# Patient Record
Sex: Male | Born: 1990 | Race: White | Hispanic: No | Marital: Single | Smoking: Never smoker
Health system: Southern US, Community
[De-identification: ages and names within clinical notes are randomized; demographics above are authoritative.]

## PROBLEM LIST (undated history)

## (undated) DIAGNOSIS — J45909 Unspecified asthma, uncomplicated: Secondary | ICD-10-CM

## (undated) DIAGNOSIS — I10 Essential (primary) hypertension: Secondary | ICD-10-CM

## (undated) HISTORY — DX: Essential (primary) hypertension: I10

## (undated) HISTORY — DX: Unspecified asthma, uncomplicated: J45.909

---

## 2015-01-31 ENCOUNTER — Emergency Department
Admit: 2015-01-31 | Disposition: A | Discharge status: Home / Self Care | Payer: Self-pay | Admitting: Emergency Medicine

## 2015-01-31 LAB — URINALYSIS, COMPLETE
BILIRUBIN, UR: NEGATIVE
Bacteria: NONE SEEN
Blood: NEGATIVE
Glucose,UR: NEGATIVE mg/dL (ref 0–75)
KETONE: NEGATIVE
LEUKOCYTE ESTERASE: NEGATIVE
Nitrite: NEGATIVE
PH: 6 (ref 4.5–8.0)
Protein: NEGATIVE
RBC, UR: NONE SEEN /HPF (ref 0–5)
SQUAMOUS EPITHELIAL: NONE SEEN
Specific Gravity: 1.008 (ref 1.003–1.030)

## 2015-01-31 LAB — CBC
HCT: 48.7 % (ref 40.0–52.0)
HGB: 16.5 g/dL (ref 13.0–18.0)
MCH: 29.7 pg (ref 26.0–34.0)
MCHC: 33.9 g/dL (ref 32.0–36.0)
MCV: 88 fL (ref 80–100)
Platelet: 367 10*3/uL (ref 150–440)
RBC: 5.57 10*6/uL (ref 4.40–5.90)
RDW: 13.4 % (ref 11.5–14.5)
WBC: 10.5 10*3/uL (ref 3.8–10.6)

## 2015-01-31 LAB — COMPREHENSIVE METABOLIC PANEL
ALK PHOS: 57 U/L
Albumin: 4.9 g/dL
Anion Gap: 7 (ref 7–16)
BILIRUBIN TOTAL: 0.5 mg/dL
BUN: 14 mg/dL
CREATININE: 0.87 mg/dL
Calcium, Total: 9.7 mg/dL
Chloride: 108 mmol/L
Co2: 26 mmol/L
EGFR (African American): >60
EGFR (Non-African Amer.): >60
Glucose: 92 mg/dL
POTASSIUM: 3.7 mmol/L
SGOT(AST): 21 U/L
SGPT (ALT): 16 U/L — ABNORMAL LOW
SODIUM: 141 mmol/L
TOTAL PROTEIN: 8.4 g/dL — AB

## 2015-01-31 LAB — ACETAMINOPHEN LEVEL: Acetaminophen: <10 ug/mL

## 2015-01-31 LAB — DRUG SCREEN, URINE
Amphetamines, Ur Screen: NEGATIVE
BENZODIAZEPINE, UR SCRN: NEGATIVE
Barbiturates, Ur Screen: NEGATIVE
Cannabinoid 50 Ng, Ur ~~LOC~~: NEGATIVE
Cocaine Metabolite,Ur ~~LOC~~: NEGATIVE
MDMA (ECSTASY) UR SCREEN: NEGATIVE
Methadone, Ur Screen: NEGATIVE
Opiate, Ur Screen: NEGATIVE
Phencyclidine (PCP) Ur S: NEGATIVE
Tricyclic, Ur Screen: NEGATIVE

## 2015-01-31 LAB — ETHANOL: Ethanol: <5 mg/dL

## 2015-01-31 LAB — SALICYLATE LEVEL: Salicylates, Serum: <4 mg/dL

## 2015-02-09 NOTE — Consult Note (Addendum)
PATIENT NAME:  Andrew Cherry, Jayshun MR#:  [id number] DATE OF BIRTH:  [birth year]  DATE OF CONSULTATION:  01/31/2015  REFERRING PHYSICIAN:   CONSULTING PHYSICIAN:  Audery AmelJohn T. Leeon Makar, MD  IDENTIFYING INFORMATION AND REASON FOR CONSULTATION: A 24 year old man with no significant past medical or psychiatric history who is brought in under involuntary commitment.   CHIEF COMPLAINT: "I overreacted."   HISTORY OF PRESENT ILLNESS: Information from the patient and the chart. The patient states that he broke up with his girlfriend just before spring break, which was several weeks ago. Ever since then he has been sort of regretting it. Despite this she does not seem to be inclined to get back together. For the first week or 2 he was feeling really sad and not eating or drinking well, although he says that that is improved for the last week and actually his mood had been a little bit better. He has been going to class and has been able to concentrate. Has been sleeping adequately, has been eating adequately. However yesterday he was scheduled to go to an important celebration where he won a scholarship and had asked this girlfriend, or ex-girlfriend, to come with them. She stood him up. He got really upset about it and was texting her. He admits that he made statements about how he was going to kill himself by jumping in front of a train. The patient absolutely denies that he had any actual thought or plan of doing anything to kill himself, but says he was just upset. Today his mood is better. He says he realizes that this was a ridiculous and inappropriate thing to say. He totally denies suicidal ideation. He has [facility] [employer] [employer] [facility] this week and has a plan to follow up with another appointment next week.   PAST PSYCHIATRIC HISTORY: He saw a therapist twice during high school for what he describes as social anxiety symptoms. Never prescribed any medicine and did not have any more therapy  than that. Never been in a psychiatric hospital. Denies any history of suicide attempts or violence.   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 He has been together with this girl for something like a year and a half when they broke up a few weeks ago. Sounds like he does not have a very warm relationship with his parents, he says that they are not really supportive of his feelings and he has not felt like he has been able to talk to them. He   does live with a roommate at school and it sounds like he has Education officer, environmental(Dictation Anomaly)  adequate social support there.  PAST MEDICAL HISTORY: Has very mild asthma for which he uses an as needed albuterol inhaler. No other ongoing medical problems.   SUBSTANCE ABUSE HISTORY: He says he only drinks on the weekend, but will sometimes drink 10 beers a day on the weekends. So far he does not feel like it has had ever caused him a problem, no one else seems to have ever indicated believing it was a problem. He says he has used marijuana a few times, but does not do it regularly. Denies other drug use.   FAMILY HISTORY: Denies knowing of any family history of mental health problems.   REVIEW OF SYSTEMS:  Currently denies depressed mood. Denies suicidal ideation. Denies any hallucinations. Physically his full review of systems is totally negative.   CURRENT MEDICATIONS: None.   ALLERGIES: No known drug allergies.  MENTAL STATUS EXAMINATION: Neatly groomed young man who looks his stated age, cooperative with the interview. Eye contact good. Psychomotor activity appropriate. Speech normal rate, tone, and volume. Affect is euthymic, reactive, appropriate. Mood stated as being all right, but mad at himself. Thoughts are lucid without loosening of associations or delusions. Denies auditory or visual hallucinations. Denies suicidal or homicidal ideation. He is alert and oriented x 4. Repeats 3 words readily and remembers all 3 at 3 minutes. Judgment  and insight appear to be improved and intact. Intelligence and fund of knowledge normal.   LABORATORY RESULTS: Alcohol level negative. Chemistry panel, nothing remarkable. Drug screen is all negative. CBC completely normal. Acetaminophen and salicylates negative.   VITAL SIGNS: His blood pressure is currently 135/90, respirations 17, pulse 77, temperature 97.8.   ASSESSMENT: A 24 year old man who made an impulsive statement about jumping in front of a train, but did not do anything to act on it. He says that this was just because he was angry and he never had any intention of doing anything to act on it. He had some symptoms that could be consistent with depression a couple of weeks ago, but those are spontaneously getting better. At this point I do not think he meets commitment criteria or requires inpatient treatment. There is no really clear indication to start medication either. I think I would give this a diagnosis of adjustment disorder.   TREATMENT PLAN: The case discussed with Emergency Room physician. He will be discharged from involuntary commitment and can follow up as scheduled with [facility]. He is counseled that if suicidal ideation recurs and is serious he needs to talk to someone and get help immediately.    DIAGNOSIS PRINCIPAL AND PRIMARY:   AXIS I: Adjustment disorder with depressed mood.   SECONDARY DIAGNOSES:   AXIS I: No further diagnosis.   AXIS II: No diagnosis.   AXIS III: Mild asthma.     ____________________________ Audery Amel, MD jtc:bu D: 01/31/2015 14:24:47 ET T: 01/31/2015 14:58:49 ET JOB#: [id number]  cc: Audery Amel, MD, <Dictator> Audery Amel MD ELECTRONICALLY SIGNED 02/14/2015 19:30

## 2015-11-13 ENCOUNTER — Other Ambulatory Visit: Payer: Self-pay | Admitting: Family Medicine

## 2015-11-13 ENCOUNTER — Ambulatory Visit
Admission: RE | Admit: 2015-11-13 | Discharge: 2015-11-13 | Disposition: A | Discharge status: Home / Self Care | Payer: BLUE CROSS/BLUE SHIELD | Source: Ambulatory Visit | Attending: Family Medicine | Admitting: Family Medicine

## 2015-11-13 DIAGNOSIS — J45909 Unspecified asthma, uncomplicated: Secondary | ICD-10-CM | POA: Insufficient documentation

## 2015-11-13 DIAGNOSIS — R918 Other nonspecific abnormal finding of lung field: Secondary | ICD-10-CM | POA: Diagnosis not present

## 2015-11-13 DIAGNOSIS — R0600 Dyspnea, unspecified: Secondary | ICD-10-CM | POA: Insufficient documentation

## 2015-11-13 DIAGNOSIS — R55 Syncope and collapse: Secondary | ICD-10-CM | POA: Diagnosis not present

## 2015-11-19 ENCOUNTER — Ambulatory Visit (INDEPENDENT_AMBULATORY_CARE_PROVIDER_SITE_OTHER): Payer: BLUE CROSS/BLUE SHIELD | Admitting: Internal Medicine

## 2015-11-19 ENCOUNTER — Encounter: Payer: Self-pay | Admitting: Internal Medicine

## 2015-11-19 ENCOUNTER — Other Ambulatory Visit
Admission: RE | Admit: 2015-11-19 | Discharge: 2015-11-19 | Disposition: A | Discharge status: Home / Self Care | Payer: BLUE CROSS/BLUE SHIELD | Source: Ambulatory Visit | Attending: Internal Medicine | Admitting: Internal Medicine

## 2015-11-19 ENCOUNTER — Telehealth: Payer: Self-pay | Admitting: Internal Medicine

## 2015-11-19 VITALS — BP 122/70 | HR 84 | Ht 74.0 in | Wt 180.6 lb

## 2015-11-19 DIAGNOSIS — F411 Generalized anxiety disorder: Secondary | ICD-10-CM

## 2015-11-19 DIAGNOSIS — R079 Chest pain, unspecified: Secondary | ICD-10-CM

## 2015-11-19 DIAGNOSIS — J4541 Moderate persistent asthma with (acute) exacerbation: Secondary | ICD-10-CM | POA: Diagnosis not present

## 2015-11-19 LAB — TROPONIN I

## 2015-11-19 LAB — FIBRIN DERIVATIVES D-DIMER (ARMC ONLY): Fibrin derivatives D-dimer (ARMC): 48 (ref 0–499)

## 2015-11-19 MED ORDER — FLUTICASONE FUROATE 200 MCG/ACT IN AEPB: 1.0000 | INHALATION_SPRAY | Freq: Every day | RESPIRATORY_TRACT | Status: AC

## 2015-11-19 MED ORDER — FLUTICASONE FUROATE 200 MCG/ACT IN AEPB
1.0000 | INHALATION_SPRAY | Freq: Every day | RESPIRATORY_TRACT | Status: AC
Start: 2015-11-19 — End: 2015-11-20

## 2015-11-19 NOTE — Addendum Note (Signed)
Addended by: Maxwell Marion A on: 11/19/2015 11:20 AM   Modules accepted: Orders

## 2015-11-19 NOTE — Addendum Note (Signed)
Addended by: Meyer Cory R on: 11/19/2015 11:54 AM   Modules accepted: Orders

## 2015-11-19 NOTE — Addendum Note (Signed)
Addended by: Maxwell Marion A on: 11/19/2015 11:11 AM   Modules accepted: Orders

## 2015-11-19 NOTE — Addendum Note (Signed)
Addended by: Maxwell Marion A on: 11/19/2015 11:29 AM   Modules accepted: Orders

## 2015-11-19 NOTE — Progress Notes (Signed)
[facility] [place] [facility] Consultation      Assessment and Plan:  Asthma. -Possible asthma, discussed in depth the possibility that he may have asthma. We'll start him on a steroid inhaler to be used once daily. We'll check pulmonary function testing.  Anxiety. -Discussed the possibility of anxiety contributing to his symptoms. His dyspnea could be triggering anxiety, which could be making his dyspnea worse. We discussed the possibility that this may be the issue, first we will rule out other organic causes of dyspnea such as cardiac and pulmonary disease.  Dyspnea. -The etiology is uncertain, but I suspect this is due to asthma, possibly exacerbated by anxiety. Given the low probability of pulmonary embolism, I will check a d-dimer. He is concerned about cardiac issues, and is interested in getting a cardiac enzyme test, I explained that this is likely of little benefit but would order it helped, his fears about cardiac issues. -We will check a pulmonary pulmonary exercise test. -We will check a echocardiogram. -We'll check a pulmonary function testing.  Fatigue, excessive daytime sleepiness. -Currently the symptoms do not appear to be consistent with sleep apnea, can readdress once further testing results are available.  Date: 11/19/2015  MRN# [id number] Andrew Cherry 1990-12-29  Referring Physician: Dr. Barnie Del is a 25 y.o. old male seen in consultation for chief complaint of:    Chief Complaint  Patient presents with  . pulmonary consult    pt. ref by [facility]. pt c/o SOB, chest tightness. fatigued. confusion X1wk denies cough, wheezing or chest pain.     HPI:   Mr. Andrew Cherry is a 25 year old referred with asthma. On review of his medical history, and recent medical records, it shows that the patient initially presented to his physician on February 1 of this year with lightheadedness, dyspnea, paresthesias. It was noted that his symptoms initially began  around December 23, when he went skiing. Since that time it was noted that he had increasing dyspnea on exertion as well as dyspnea at rest.  He feels that his symptoms started in December, but things got worse this past week, with chest tightness, which made him very tired. It also really triggered his anxiety, and often felt that he could not speak and recalls feeling very scared. He also feels very fatigued and tired.   This has never happened before. He has never been diagnosed with allergies, he has no pets, he had a dog when growing up. He does not smoke. He has not drank in 2 weeks.  He drinks 10 to 15 drinks per week, this is usually over 1 or 2 nights. When he drinks primarily beer.  He has been on z pack, xopenex. He did not feel that the inhaler helped at all. He went to [place] 2 weeks ago for now boarding but had a lot of trouble breathing and could not participate much. He denies sinus symptoms. He himself has had asthma in the past.   He is graduating in May from [employer], he is going to take a job in [place] in July.   Review of lab studies from from her second 2017: Normal metabolic panel. Chest x-ray 11/13/2015, images report reviewed: Per report, there is some mild peribronchial cuffing, my review, this is otherwise a normal chest x-ray.  He was ambulated in the office today, Beginning 02 sat was 99% with HR 88. After walking 1500 feet sat was 98% with HR 102.   PMHX:   Past Medical History  Diagnosis Date  .  Hypertension   . Asthma    Surgical Hx:  History reviewed. No pertinent past surgical history. Family Hx:  Family History  Problem Relation Age of Onset  . Family history unknown: Yes   Social Hx:   Social History  Substance Use Topics  . Smoking status: Never Smoker   . Smokeless tobacco: None  . Alcohol Use: Yes   Medication:   Current Outpatient Rx  Name  Route  Sig  Dispense  Refill  . cholecalciferol (VITAMIN D) 1000 units tablet   Oral   Take 5,000  Units by mouth daily.         Marland Kitchen levalbuterol (XOPENEX HFA) 45 MCG/ACT inhaler                   Allergies:  Review of patient's allergies indicates not on file.  Review of Systems: Gen:  Denies  fever, sweats, chills HEENT: Denies . bleeds, sore throat Cvc:  No dizziness, chest pain. Resp:   Denies cough or sputum porduction,  Gi: Denies swallowing difficulty, stomach pain. Gu:  Denies bladder incontinence, burning urine Ext:   No Joint pain, stiffness. Skin: No skin rash,  hives Endoc:  No polyuria, polydipsia. Psych: No depression, insomnia. Other:  All other systems were reviewed with the patient and were negative other that what is mentioned in the HPI.   Physical Examination:   VS: BP 122/70 mmHg  Pulse 84  Ht  (1.88 m)  Wt 180 lb 9.6 oz (81.92 kg)  BMI 23.18 kg/m2  SpO2 96%  General Appearance: No distress , he appears anxious today. Neuro:without focal findings,  speech normal,  HEENT: PERRLA, EOM intact.   Pulmonary: normal breath sounds, No wheezing.  CardiovascularNormal S1,S2.  No m/r/g.   Abdomen: Benign, Soft, non-tender. Renal:  No costovertebral tenderness  GU:  No performed at this time. Endoc: No evident thyromegaly, no signs of acromegaly. Skin:   warm, no rashes, no ecchymosis  Extremities: normal, no cyanosis, clubbing.  Other findings:    LABORATORY PANEL:   CBC No results for input(s): WBC, HGB, HCT, PLT in the last 168 hours. ------------------------------------------------------------------------------------------------------------------  Chemistries  No results for input(s): NA, K, CL, CO2, GLUCOSE, BUN, CREATININE, CALCIUM, MG, AST, ALT, ALKPHOS, BILITOT in the last 168 hours.  Invalid input(s): GFRCGP ------------------------------------------------------------------------------------------------------------------  Cardiac Enzymes No results for input(s): TROPONINI in the last 168  hours. ------------------------------------------------------------  RADIOLOGY:  No results found.     Thank  you for the consultation and for allowing [facility] to assist in the care of your patient. Our recommendations are noted above.  Please contact us if we can be of further service.   Wells Guiles, MD.  Board Certified in Internal Medicine, Pulmonary Medicine, Critical Care Medicine, and Sleep Medicine.  [facility]  Santiago Glad, M.D.  Stephanie Acre, M.D.  Billy Fischer, M.D

## 2015-11-19 NOTE — Patient Instructions (Addendum)
-  d-dimer, troponin test to be done today. -Cardiopulmonary exercise testing. -Complete pulmonary function testing. -Echocardiogram with contrast.  -Start Arnuity inhaler 1 puff once daily. Rinse out her mouth after each use. -When her breathing becomes worse, try to, her breathing down by breathing in for one second and blowing out for 3 seconds. -Continue working on relaxation techniques-trying not to spend time worrying about  breathing issues, as this may only make breathing worse.

## 2015-11-19 NOTE — Telephone Encounter (Signed)
Called and spoke with the pt's Mother and EC, Annice Pih  We had a 10 min conversation about today's visit  She was concerned that the CPST is not scheduled until 2 wks from now  I advised that this seems reasonable to me, but will ask Dr Nicholos Johns if he feels that this is booked too far out  She also asked about why an abx was not prescribed, b/c the reason for the visit today was to discuss "lung infection" I advised that if need for abx was indicated we could have prescribed that today, but since there was no indication for one (purulent sputum, fever) this was not necc  She understands this, but wants to know if infection was discussed at all  She seems very anxious about what to do in case pt starts feeling bad again, b/c today was a "good day" I advised her that they can always call for any questions/concerns, but incase of emergency to seek emergent care  She verbalized understanding  Please advise on your thoughts about waiting 2 wks for CPST and also about possibility of infections playing a part of his fatigue and dyspnea Thanks!

## 2015-11-20 ENCOUNTER — Other Ambulatory Visit: Payer: Self-pay

## 2015-11-20 ENCOUNTER — Encounter: Payer: Self-pay | Admitting: Internal Medicine

## 2015-11-20 ENCOUNTER — Ambulatory Visit (INDEPENDENT_AMBULATORY_CARE_PROVIDER_SITE_OTHER): Payer: BLUE CROSS/BLUE SHIELD

## 2015-11-20 DIAGNOSIS — R079 Chest pain, unspecified: Secondary | ICD-10-CM

## 2015-11-21 ENCOUNTER — Telehealth: Payer: Self-pay

## 2015-11-21 NOTE — Telephone Encounter (Signed)
Pt mother called, and would like lab results. Also, she would like to know if pt appointments should be sooner, please. Call.

## 2015-11-21 NOTE — Telephone Encounter (Signed)
Spoke with pt and mother and they are both informed of response. Nothing further needed.

## 2015-11-21 NOTE — Telephone Encounter (Signed)
2 weeks for CP stress test is ok.  Infection was addressed, there is no signs of infection at this time and the CXR was clear.

## 2015-11-21 NOTE — Telephone Encounter (Signed)
Pt and mother both informed of lab results. Nothing further needed.

## 2015-11-25 ENCOUNTER — Emergency Department (HOSPITAL_COMMUNITY)
Admission: EM | Admit: 2015-11-25 | Discharge: 2015-11-25 | Disposition: A | Discharge status: Home / Self Care | Payer: BLUE CROSS/BLUE SHIELD | Attending: Emergency Medicine | Admitting: Emergency Medicine

## 2015-11-25 ENCOUNTER — Encounter (HOSPITAL_COMMUNITY): Payer: Self-pay

## 2015-11-25 ENCOUNTER — Telehealth: Payer: Self-pay | Admitting: *Deleted

## 2015-11-25 ENCOUNTER — Ambulatory Visit (HOSPITAL_COMMUNITY): Payer: BLUE CROSS/BLUE SHIELD

## 2015-11-25 ENCOUNTER — Emergency Department (HOSPITAL_COMMUNITY): Payer: BLUE CROSS/BLUE SHIELD

## 2015-11-25 DIAGNOSIS — M6281 Muscle weakness (generalized): Secondary | ICD-10-CM | POA: Diagnosis not present

## 2015-11-25 DIAGNOSIS — R200 Anesthesia of skin: Secondary | ICD-10-CM | POA: Insufficient documentation

## 2015-11-25 DIAGNOSIS — R251 Tremor, unspecified: Secondary | ICD-10-CM | POA: Insufficient documentation

## 2015-11-25 DIAGNOSIS — Z79899 Other long term (current) drug therapy: Secondary | ICD-10-CM | POA: Diagnosis not present

## 2015-11-25 DIAGNOSIS — J45909 Unspecified asthma, uncomplicated: Secondary | ICD-10-CM | POA: Diagnosis not present

## 2015-11-25 DIAGNOSIS — R410 Disorientation, unspecified: Secondary | ICD-10-CM | POA: Insufficient documentation

## 2015-11-25 DIAGNOSIS — R5383 Other fatigue: Secondary | ICD-10-CM | POA: Diagnosis not present

## 2015-11-25 DIAGNOSIS — I10 Essential (primary) hypertension: Secondary | ICD-10-CM | POA: Insufficient documentation

## 2015-11-25 DIAGNOSIS — R5381 Other malaise: Secondary | ICD-10-CM | POA: Insufficient documentation

## 2015-11-25 LAB — BASIC METABOLIC PANEL
ANION GAP: 12 (ref 5–15)
BUN: 11 mg/dL (ref 6–20)
CHLORIDE: 104 mmol/L (ref 101–111)
CO2: 26 mmol/L (ref 22–32)
Calcium: 10.1 mg/dL (ref 8.9–10.3)
Creatinine, Ser: 0.95 mg/dL (ref 0.61–1.24)
GFR calc Af Amer: >60 mL/min (ref >60)
GFR calc non Af Amer: >60 mL/min (ref >60)
Glucose, Bld: 103 mg/dL — ABNORMAL HIGH (ref 65–99)
Potassium: 4.1 mmol/L (ref 3.5–5.1)
Sodium: 142 mmol/L (ref 135–145)

## 2015-11-25 LAB — CBC
HEMATOCRIT: 46.5 % (ref 39.0–52.0)
HEMOGLOBIN: 16.6 g/dL (ref 13.0–17.0)
MCH: 30.3 pg (ref 26.0–34.0)
MCHC: 35.7 g/dL (ref 30.0–36.0)
MCV: 85 fL (ref 78.0–100.0)
Platelets: 290 10*3/uL (ref 150–400)
RBC: 5.47 MIL/uL (ref 4.22–5.81)
RDW: 12.3 % (ref 11.5–15.5)
WBC: 7.9 10*3/uL (ref 4.0–10.5)

## 2015-11-25 LAB — URINALYSIS, ROUTINE W REFLEX MICROSCOPIC
Bilirubin Urine: NEGATIVE
GLUCOSE, UA: NEGATIVE mg/dL
Hgb urine dipstick: NEGATIVE
Ketones, ur: NEGATIVE mg/dL
Leukocytes, UA: NEGATIVE
NITRITE: NEGATIVE
PH: 7 (ref 5.0–8.0)
Protein, ur: NEGATIVE mg/dL
SPECIFIC GRAVITY, URINE: 1.005 (ref 1.005–1.030)

## 2015-11-25 LAB — I-STAT CG4 LACTIC ACID, ED: Lactic Acid, Venous: 1.58 mmol/L (ref 0.5–2.0)

## 2015-11-25 NOTE — Telephone Encounter (Signed)
Patient's mother called and patient was unable to perform stress and sent to the ED at [facility]. She is very worried and would like for you to call her and have Dr. Nicholos Johns informed.

## 2015-11-25 NOTE — Discharge Instructions (Signed)
CT scan of your brain, and the rest of the laboratory evaluations, were normal today. We recommend that you follow-up with the neurologist tomorrow as scheduled, and continue to work with your primary care doctor and pulmonologist to determine the source of your discomfort.     Fatigue Fatigue is feeling tired all of the time, a lack of energy, or a lack of motivation. Occasional or mild fatigue is often a normal response to activity or life in general. However, long-lasting (chronic) or extreme fatigue may indicate an underlying medical condition. HOME CARE INSTRUCTIONS  Watch your fatigue for any changes. The following actions may help to lessen any discomfort you are feeling:  Talk to your health care provider about how much sleep you need each night. Try to get the required amount every night.  Take medicines only as directed by your health care provider.  Eat a healthy and nutritious diet. Ask your health care provider if you need help changing your diet.  Drink enough fluid to keep your urine clear or pale yellow.  Practice ways of relaxing, such as yoga, meditation, massage therapy, or acupuncture.  Exercise regularly.   Change situations that cause you stress. Try to keep your work and personal routine reasonable.  Do not abuse illegal drugs.  Limit alcohol intake to no more than 1 drink per day for nonpregnant women and 2 drinks per day for men. One drink equals 12 ounces of beer, 5 ounces of wine, or 1 ounces of hard liquor.  Take a multivitamin, if directed by your health care provider. SEEK MEDICAL CARE IF:   Your fatigue does not get better.  You have a fever.   You have unintentional weight loss or gain.  You have headaches.   You have difficulty:   Falling asleep.  Sleeping throughout the night.  You feel angry, guilty, anxious, or sad.   You are unable to have a bowel movement (constipation).   You skin is dry.   Your legs or another part  of your body is swollen.  SEEK IMMEDIATE MEDICAL CARE IF:   You feel confused.   Your vision is blurry.  You feel faint or pass out.   You have a severe headache.   You have severe abdominal, pelvic, or back pain.   You have chest pain, shortness of breath, or an irregular or fast heartbeat.   You are unable to urinate or you urinate less than normal.   You develop abnormal bleeding, such as bleeding from the rectum, vagina, nose, lungs, or nipples.  You vomit blood.   You have thoughts about harming yourself or committing suicide.   You are worried that you might harm someone else.    This information is not intended to replace advice given to you by your health care provider. Make sure you discuss any questions you have with your health care provider.   Document Released: 07/25/2007 Document Revised: 10/18/2014 Document Reviewed: 01/29/2014 Elsevier Interactive Patient Education [employer]

## 2015-11-25 NOTE — Telephone Encounter (Signed)
Spoke with mom and she states that the pt couldn't complete the stress test and was taken to [facility] ED. She was concerned that pt was there alone and questioned whether or not pulmonary doctor  Would come to see pt in ED. Informed her if the ED doc needs pulmonary that they would call them in. Mom wants to make you aware.

## 2015-11-25 NOTE — ED Notes (Signed)
Family at bedside. 

## 2015-11-25 NOTE — ED Provider Notes (Signed)
CSN: [id number]     Arrival date & time 11/25/15  1347 History   First MD Initiated Contact with Patient 11/25/15 1713     Chief Complaint  Patient presents with  . from cardiology   . Fatigue     (Consider location/radiation/quality/duration/timing/severity/associated sxs/prior Treatment) HPI   Andrew Cherry is a 25 y.o. male who is here for evaluation of an episode of shakiness, global weakness, confusion and ongoing fatigue. He was being readied for a cardiac stress test today, at a local cardiology office, when he had onset of symptoms, which prevented him from beginning the test. He describes "shaking uncontrollably" after that, he had onset of bilateral leg numbness associated with weakness in both legs. He was able to ambulate, and was brought here by medical staff for evaluation. At the time of evaluation, by me, he states that the shakiness is almost completely gone. The numbness is fairly minimal. He presented to triage where labs were obtained. He is being seen by his primary care provider at a local college, and has been referred to pulmonary who treated him for asthma was opened next and a steroid inhaler. He stopped using the steroid inhaler and uses Xopenex, twice a day. He states the Xopenex doesn't do anything to help his feeling of shortness of breath, which is chronic and ongoing for at least a month. He reports having an appointment tomorrow with a neurologist for further evaluation of his symptoms. He denies depression, anxiety or significant stress at this time. He is in college, his last semester and has a job already secured to start in May 2017. He states he goes to class when he feels like it, but is not in danger of failing the classes at this time. He is here with his girlfriend, and states that he is getting along with her very well. There are no other known modifying factors.   Past Medical History  Diagnosis Date  . Hypertension   . Asthma    History reviewed.  No pertinent past surgical history. Family History  Problem Relation Age of Onset  . Family history unknown: Yes   Social History  Substance Use Topics  . Smoking status: Never Smoker   . Smokeless tobacco: None  . Alcohol Use: Yes    Review of Systems  All other systems reviewed and are negative.     Allergies  Review of patient's allergies indicates no known allergies.  Home Medications   Prior to Admission medications   Medication Sig Start Date End Date Taking? Authorizing Provider  cholecalciferol (VITAMIN D) 1000 units tablet Take 5,000 Units by mouth daily.   Yes Historical Provider, MD  levalbuterol [facility] HFA) 45 MCG/ACT inhaler Inhale 1-2 puffs into the lungs every 6 (six) hours as needed for wheezing or shortness of breath.  11/13/15  Yes Historical Provider, MD  Fluticasone Furoate (ARNUITY ELLIPTA) 200 MCG/ACT AEPB Inhale 1 puff into the lungs daily. Patient not taking: Reported on 11/25/2015 11/19/15   Shane Crutch, MD   BP 126/76 mmHg  Pulse 81  Temp(Src) 98.9 F (37.2 C) (Oral)  Resp 21  Ht  (1.905 m)  Wt 177 lb (80.287 kg)  BMI 22.12 kg/m2  SpO2 99% Physical Exam  Constitutional: He is oriented to person, place, and time. He appears well-developed and well-nourished. No distress.  HENT:  Head: Normocephalic and atraumatic.  Right Ear: External ear normal.  Left Ear: External ear normal.  Eyes: Conjunctivae and EOM are normal. Pupils are equal,  round, and reactive to light.  Neck: Normal range of motion and phonation normal. Neck supple.  Cardiovascular: Normal rate, regular rhythm and normal heart sounds.   Pulmonary/Chest: Effort normal and breath sounds normal. He exhibits no bony tenderness.  Abdominal: Soft. There is no tenderness.  Musculoskeletal: Normal range of motion.  Neurological: He is alert and oriented to person, place, and time. No cranial nerve deficit or sensory deficit. He exhibits normal muscle tone. Coordination normal.   No dysarthria, aphasia or nystagmus. Normal gait.  Skin: Skin is warm, dry and intact.  Psychiatric: He has a normal mood and affect. His behavior is normal. Judgment and thought content normal.  Nursing note and vitals reviewed.   ED Course  Procedures (including critical care time)  The patient requested a CT scan of the brain to be evaluated for his problems. I ordered that with the indicators being his symptoms of shakiness, weakness, and numbness.  Medications - No data to display  Patient Vitals for the past 24 hrs:  BP Temp Temp src Pulse Resp SpO2 Height Weight  11/25/15 1800 126/76 mmHg - - 81 21 99 % - -  11/25/15 1715 144/82 mmHg - - 89 22 100 % - -  11/25/15 1500 - 98.9 F (37.2 C) Oral 101 20 100 %  (1.905 m) 177 lb (80.287 kg)    6:47 PM Reevaluation with update and discussion. After initial assessment and treatment, an updated evaluation reveals a change in clinical status. Findings discussed with patient, and persons with him, all questions were answered. [name]     Labs Review Labs Reviewed  BASIC METABOLIC PANEL - Abnormal; Notable for the following:    Glucose, Bld 103 (*)    All other components within normal limits  CBC  URINALYSIS, ROUTINE W REFLEX MICROSCOPIC (NOT AT [facility])  I-STAT CG4 LACTIC ACID, ED    Imaging Review Ct Head Wo Contrast  11/25/2015  CLINICAL DATA:  25 year old male with confusion and numbness which occurred well undergoing a nuclear medicine cardiac stress test. EXAM: CT HEAD WITHOUT CONTRAST TECHNIQUE: Contiguous axial images were obtained from the base of the skull through the vertex without intravenous contrast. COMPARISON:  None. FINDINGS: Negative for acute intracranial hemorrhage, acute infarction, mass, mass effect, hydrocephalus or midline shift. Gray-white differentiation is preserved throughout. No acute soft tissue or calvarial abnormality. The globes and orbits are symmetric and unremarkable. Normal aeration of  the mastoid air cells and visualized paranasal sinuses. IMPRESSION: Negative head CT. Electronically Signed   By: Malachy Moan M.D.   On: 11/25/2015 18:36   I have personally reviewed and evaluated these images and lab results as part of my medical decision-making.   EKG Interpretation None      MDM   Final diagnoses:  Malaise and fatigue     Nonspecific episode of shakiness, numbness and weakness, essentially resolved by the time of evaluation, and normal evaluation in the emergency department setting. There is no indication for further evaluation or treatment at this time.  Nursing Notes Reviewed/ Care Coordinated Applicable Imaging Reviewed Interpretation of Laboratory Data incorporated into ED treatment  The patient appears reasonably screened and/or stabilized for discharge and I doubt any other medical condition or other [facility] requiring further screening, evaluation, or treatment in the ED at this time prior to discharge.  Plan: Home Medications- usual; Home Treatments- rest; return here if the recommended treatment, does not improve the symptoms; Recommended follow up- PCP and Neurology as scheduled  Mancel Bale, MD 11/25/15 [phone]

## 2015-11-25 NOTE — ED Notes (Signed)
Patient is alert and orientedx4.  Patient was explained discharge instructions and they understood them with no questions.   

## 2015-11-25 NOTE — ED Notes (Addendum)
Patient here from [facility] breathing exam for this persistent fatigue, shaking, leg weakness and confusion. Shortness of breath with same All symptoms started after a ski trip 2 weeks ago. Scheduled to see neurology tomorrow.

## 2015-11-26 ENCOUNTER — Other Ambulatory Visit (HOSPITAL_COMMUNITY): Payer: Self-pay | Admitting: *Deleted

## 2015-11-26 DIAGNOSIS — J4541 Moderate persistent asthma with (acute) exacerbation: Secondary | ICD-10-CM

## 2015-11-26 NOTE — Telephone Encounter (Signed)
Continue with all scheduled testing as previously planned. Try to reschedule the cardio-pulmonary test if possible.

## 2015-11-26 NOTE — Telephone Encounter (Signed)
Spoke with pt and he states that he is sitting at [facility] ER. States the neurologist sent him there from the office b/c of shaking, sweating and confusion. States they are working him up for MS. States he doesn't feel he will be able to do any testing especially the stress test. States idf he doesn't fell he can do the PFT he will call back to cancel appt. He ask that I make you aware of what is going on.

## 2015-11-28 NOTE — Telephone Encounter (Signed)
I suggest that he complete the workup with neurology before proceeding with the  tests that we ordered because my suspicion is that he may not have a primary lung disease.

## 2015-11-28 NOTE — Telephone Encounter (Signed)
Pt states he has been in hospital for a couple days. Informed pt of DR response. Pt agrees. Nothing further needed.

## 2015-11-28 NOTE — Telephone Encounter (Signed)
LMOM for pt to return call to discuss response from DR.

## 2015-12-01 ENCOUNTER — Encounter (HOSPITAL_COMMUNITY): Payer: BLUE CROSS/BLUE SHIELD

## 2015-12-10 ENCOUNTER — Other Ambulatory Visit: Payer: Self-pay | Admitting: Neurology

## 2015-12-10 DIAGNOSIS — G610 Guillain-Barre syndrome: Secondary | ICD-10-CM

## 2015-12-16 ENCOUNTER — Ambulatory Visit
Admission: RE | Admit: 2015-12-16 | Discharge: 2015-12-16 | Disposition: A | Discharge status: Home / Self Care | Payer: BLUE CROSS/BLUE SHIELD | Source: Ambulatory Visit | Attending: Neurology | Admitting: Neurology

## 2015-12-16 DIAGNOSIS — R531 Weakness: Secondary | ICD-10-CM | POA: Diagnosis not present

## 2015-12-16 DIAGNOSIS — G610 Guillain-Barre syndrome: Secondary | ICD-10-CM

## 2015-12-16 LAB — CSF CELL COUNT WITH DIFFERENTIAL
RBC COUNT CSF: 3 /mm3 (ref 0–3)
RBC Count, CSF: 2 /mm3 (ref 0–3)
TUBE #: 4
Tube #: 1
WBC CSF: 0 /mm3
WBC, CSF: 0 /mm3

## 2015-12-16 LAB — CBC
HCT: 46 % (ref 40.0–52.0)
Hemoglobin: 16.2 g/dL (ref 13.0–18.0)
MCH: 29.8 pg (ref 26.0–34.0)
MCHC: 35.2 g/dL (ref 32.0–36.0)
MCV: 84.7 fL (ref 80.0–100.0)
PLATELETS: 252 10*3/uL (ref 150–440)
RBC: 5.43 MIL/uL (ref 4.40–5.90)
RDW: 12.7 % (ref 11.5–14.5)
WBC: 7.7 10*3/uL (ref 3.8–10.6)

## 2015-12-16 LAB — GLUCOSE, CSF: GLUCOSE CSF: 56 mg/dL (ref 40–70)

## 2015-12-16 LAB — PROTIME-INR
INR: 1.15
PROTHROMBIN TIME: 14.9 s (ref 11.4–15.0)

## 2015-12-16 LAB — PROTEIN, CSF: Total  Protein, CSF: 27 mg/dL (ref 15–45)

## 2015-12-16 LAB — ALBUMIN: Albumin: 5.2 g/dL — ABNORMAL HIGH (ref 3.5–5.0)

## 2015-12-16 LAB — APTT: aPTT: 31 seconds (ref 24–36)

## 2015-12-16 MED ORDER — ACETAMINOPHEN 500 MG PO TABS
1000.0000 mg | ORAL_TABLET | Freq: Four times a day (QID) | ORAL | Status: DC | PRN
  Filled 2015-12-16: qty 2

## 2015-12-17 LAB — CSF IGG: IgG, CSF: 2.8 mg/dL (ref 0.0–8.6)

## 2015-12-18 ENCOUNTER — Telehealth: Payer: Self-pay | Admitting: Internal Medicine

## 2015-12-18 LAB — OLIGOCLONAL BANDS, CSF + SERM

## 2015-12-18 LAB — CYTOLOGY - NON PAP

## 2015-12-18 NOTE — Telephone Encounter (Signed)
Pt mother called stating pt was reminded of apt that she thinks ( is not sure) needed for tomorrow  For this apt is a fu from tests patient was to do But PFT and Echo pt could not do.  May need to reschedule them but pt is not sure if we still need them or not Please call back and let them know.

## 2015-12-19 ENCOUNTER — Ambulatory Visit: Payer: BLUE CROSS/BLUE SHIELD | Admitting: Internal Medicine

## 2015-12-19 NOTE — Telephone Encounter (Signed)
Pt's mother cancelled appt.

## 2015-12-20 LAB — CSF CULTURE W GRAM STAIN
Culture: NO GROWTH
Gram Stain: NONE SEEN

## 2015-12-20 LAB — CSF CULTURE

## 2017-05-06 IMAGING — CR DG CHEST 2V
1 series · 2 of 2 positions shown · non-contrast
Comparison: None

CLINICAL DATA: Dyspnea intermittently for a month worse in past 3
days, short of breath after activity and walking, near syncopal
feeling, history asthma

EXAM:
CHEST  2 VIEW

[Series 1: dg chest 2 view · 0.14mm/px · 2 of 2 slices shown]
[im 1/2]
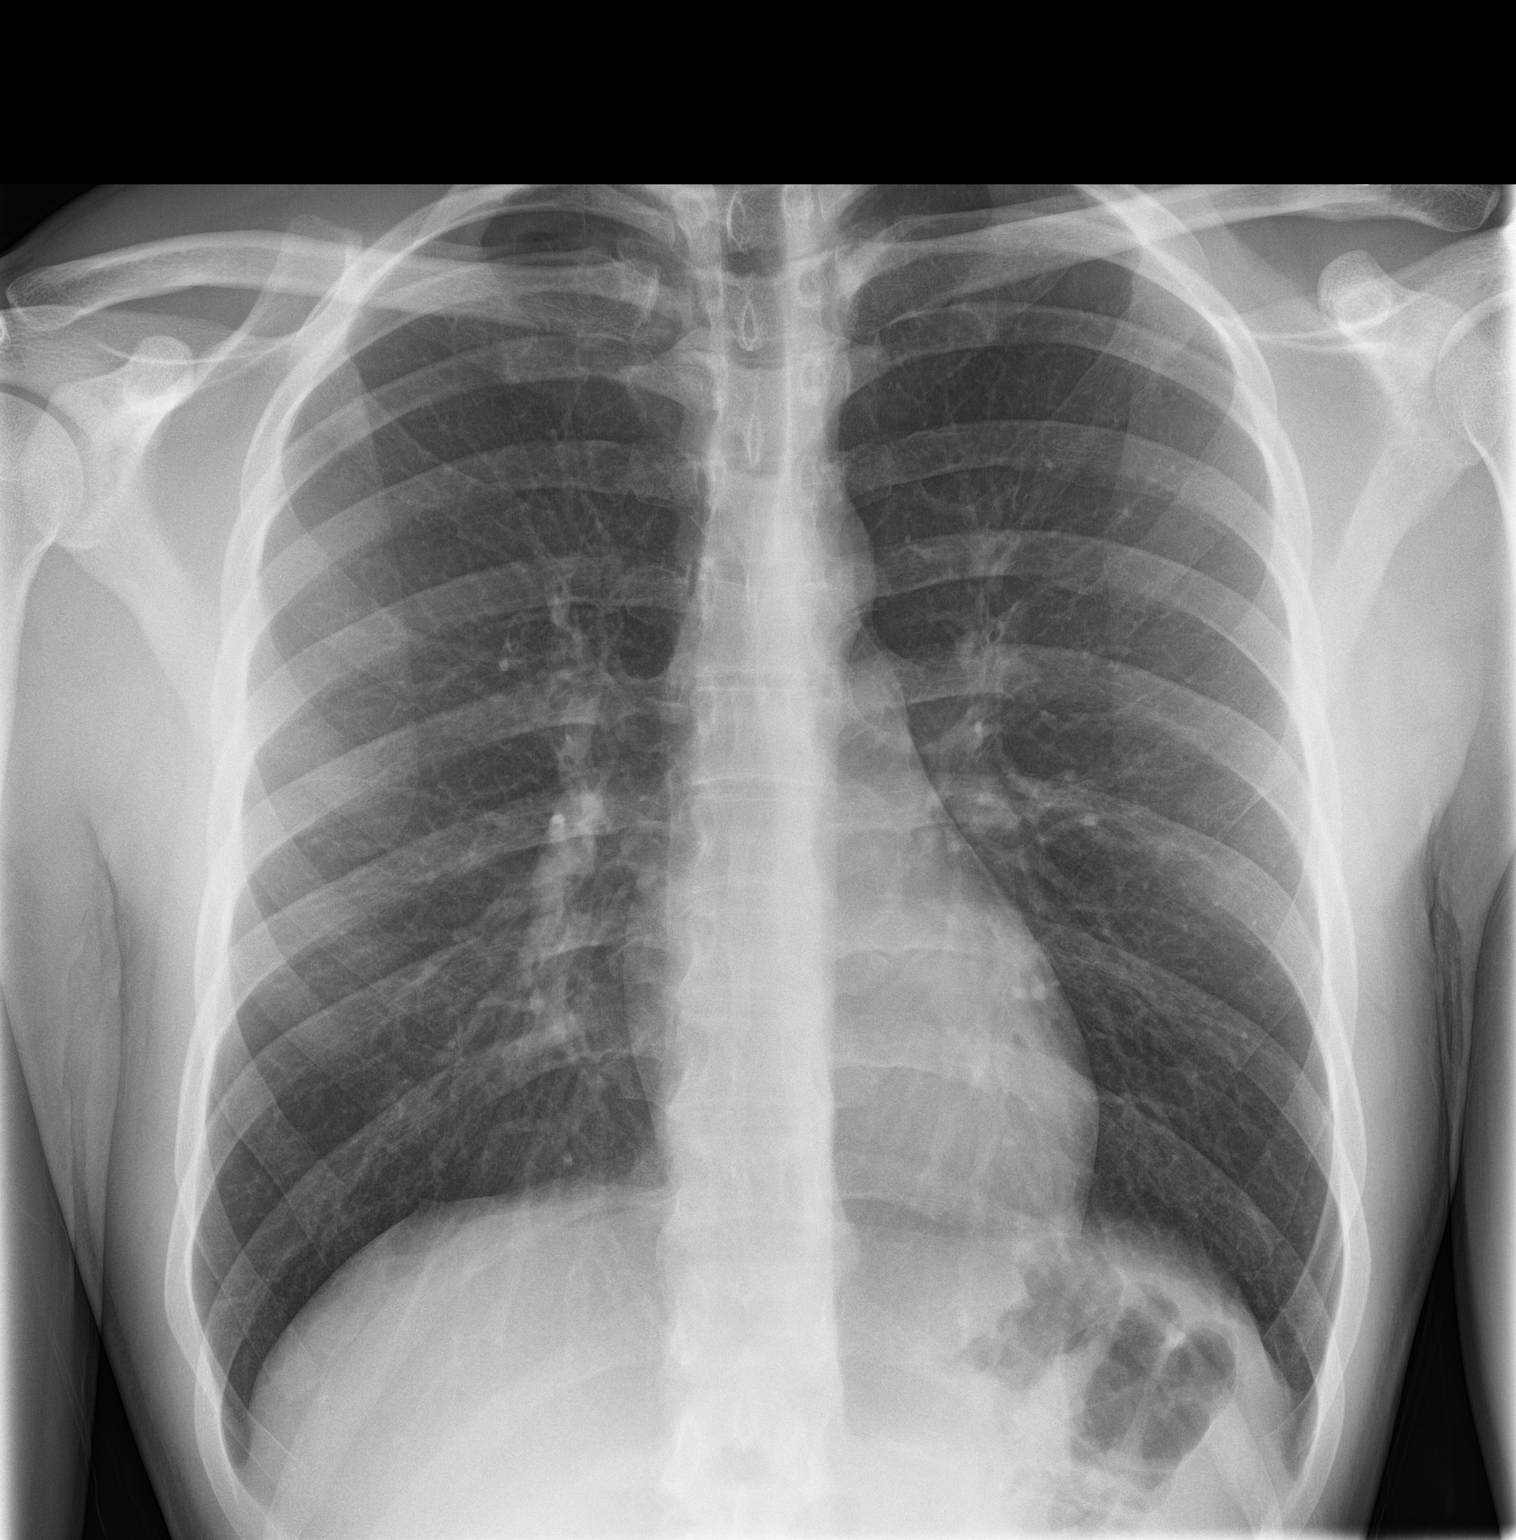
[im 2/2]
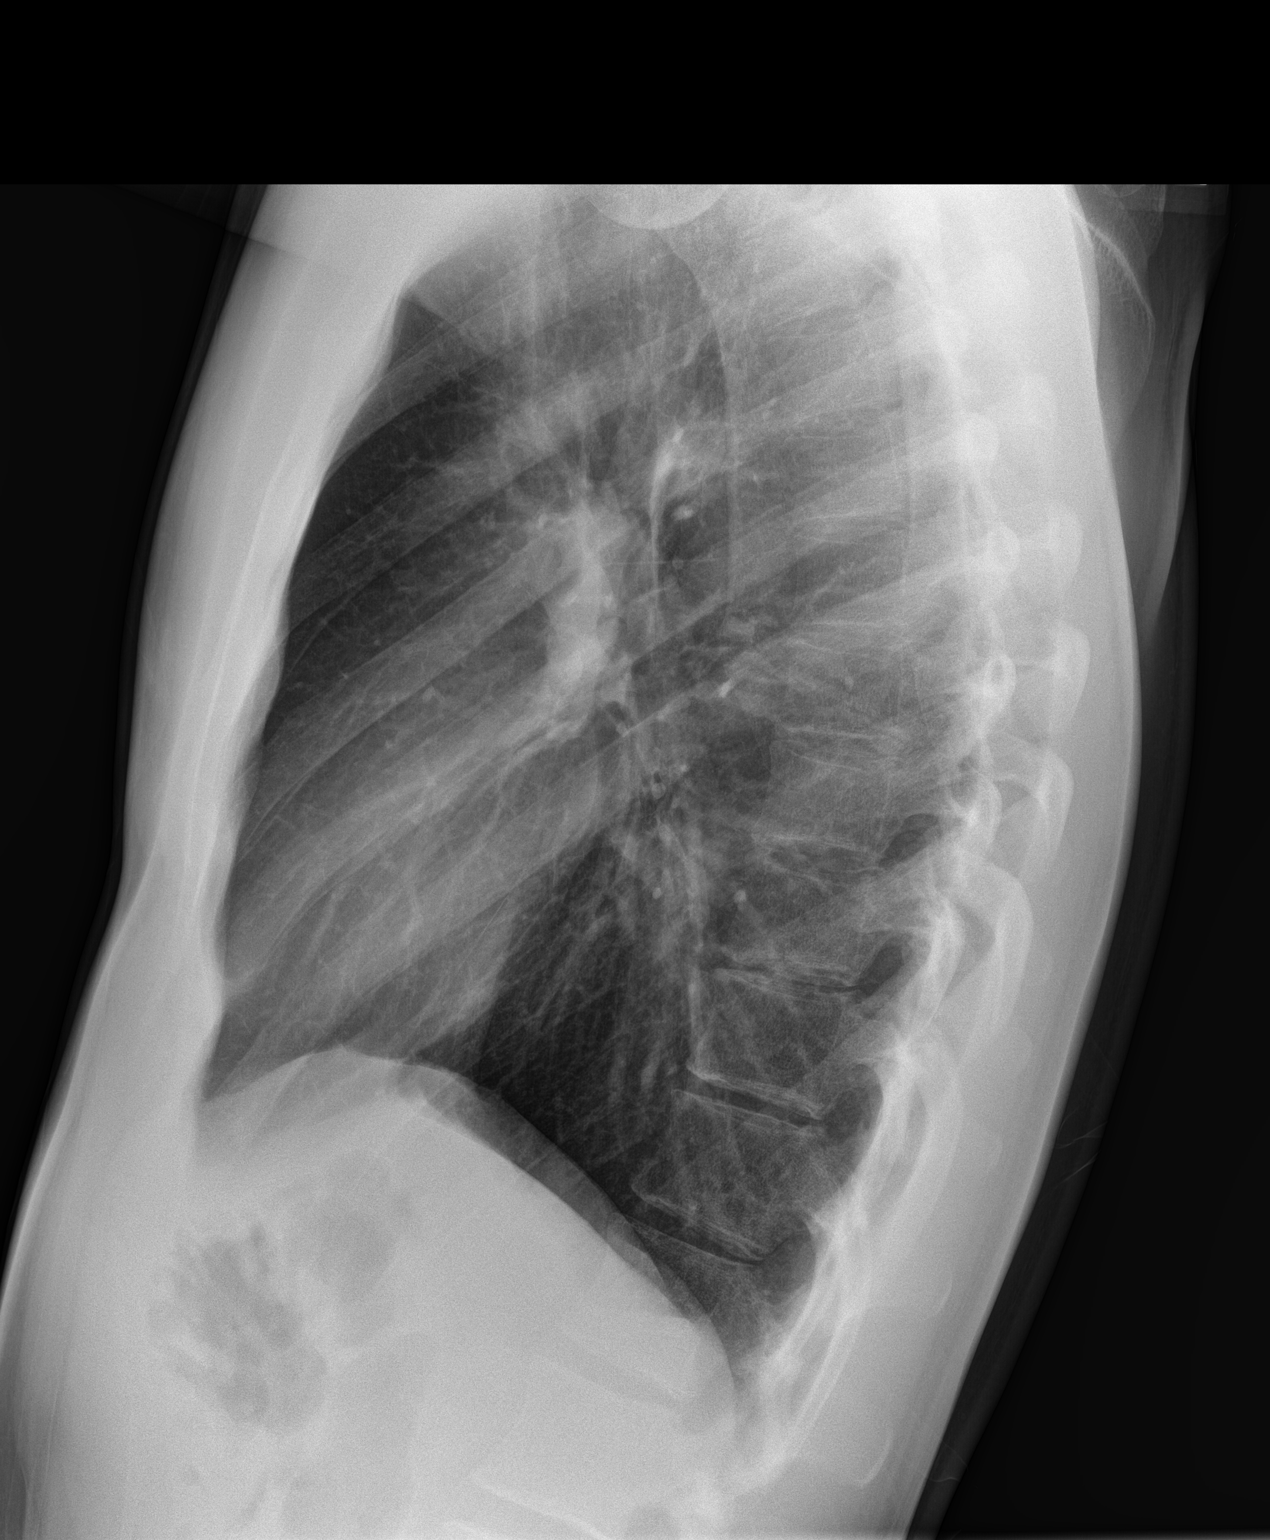

[2 of 2 positions shown; findings below may reference images not displayed]

FINDINGS: Normal heart size, mediastinal contours, and pulmonary vascularity.

Peribronchial thickening and mild hyperinflation which could reflect
history of asthma.

No definite infiltrate, pleural effusion or pneumothorax.

Bones unremarkable.
IMPRESSION: Peribronchial thickening and slight hyperinflation question related
to asthma.

No infiltrate.

## 2017-05-18 IMAGING — CT CT HEAD W/O CM
1 series · 16 of 30 positions shown, 20 images · non-contrast
Comparison: None.

CLINICAL DATA: 24-year-old male with confusion and numbness which
occurred well undergoing a nuclear medicine cardiac stress test.

EXAM:
CT HEAD WITHOUT CONTRAST
TECHNIQUE: Contiguous axial images were obtained from the base of the skull
through the vertex without intravenous contrast.

[Series 2: head 5.0 h30s · axial · 0.44mm/px · z∈[+293,+443]mm · 16 of 34 slices shown, 20 images]
[im 2/34  brain]
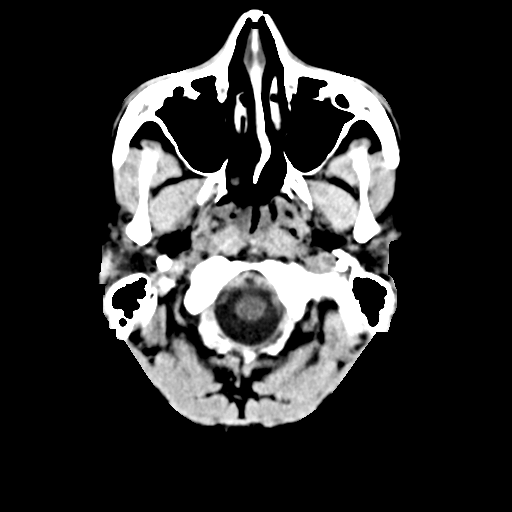
[im 2/34  bone]
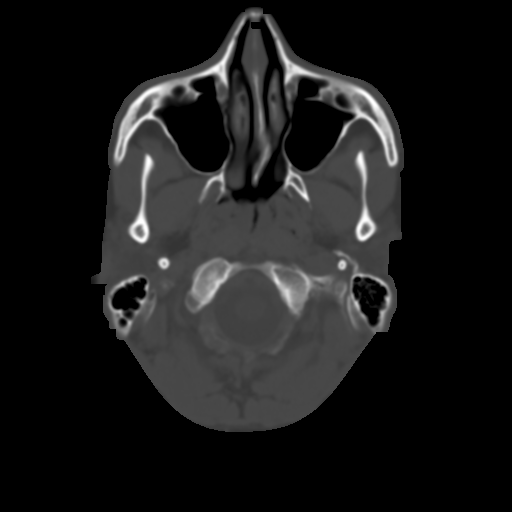
[im 4/34  brain]
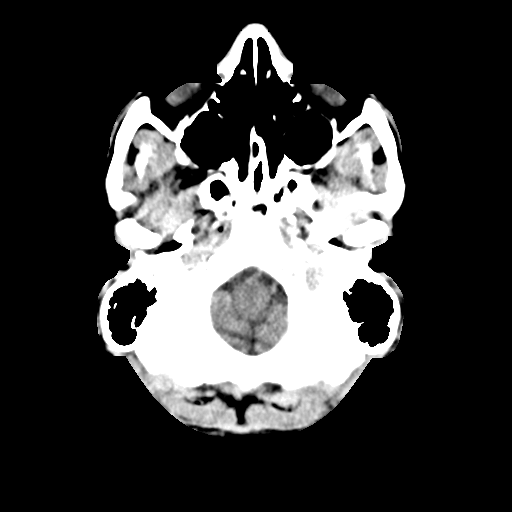
[im 6/34  brain]
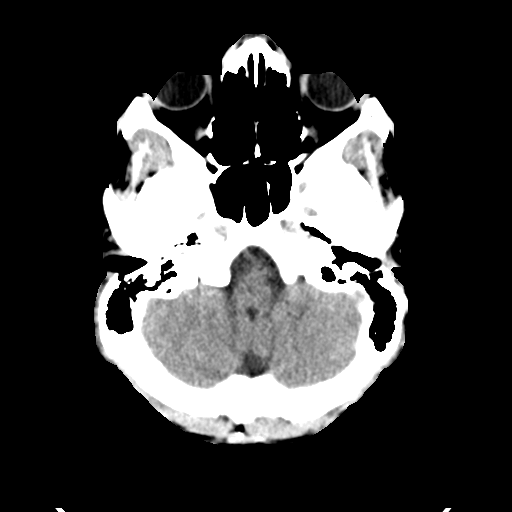
[im 8/34  brain]
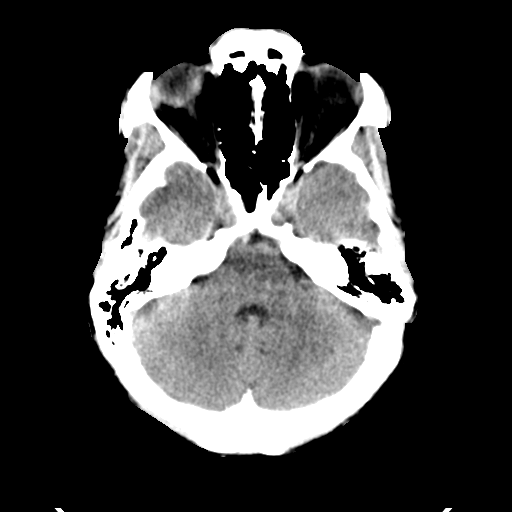
[im 10/34  brain]
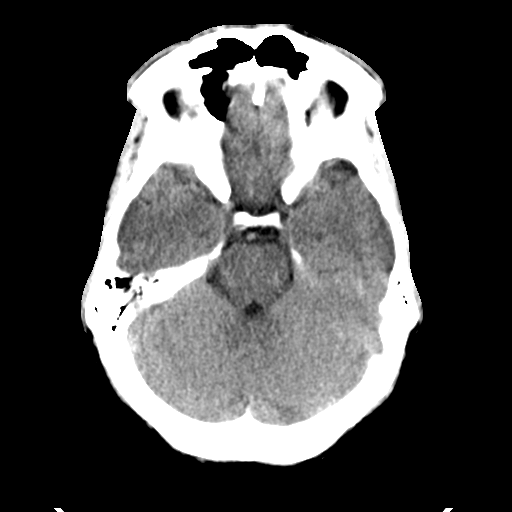
[im 10/34  bone]
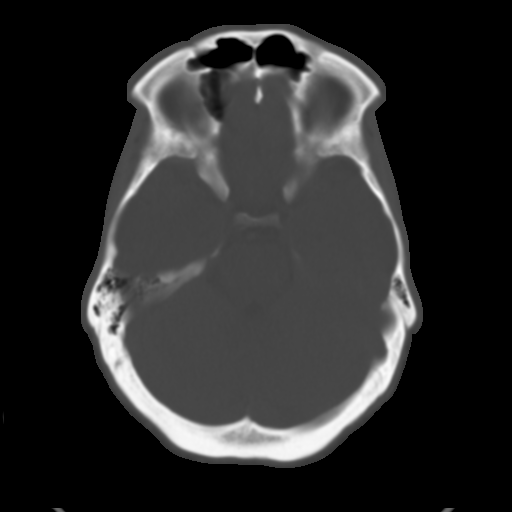
[im 12/34  brain]
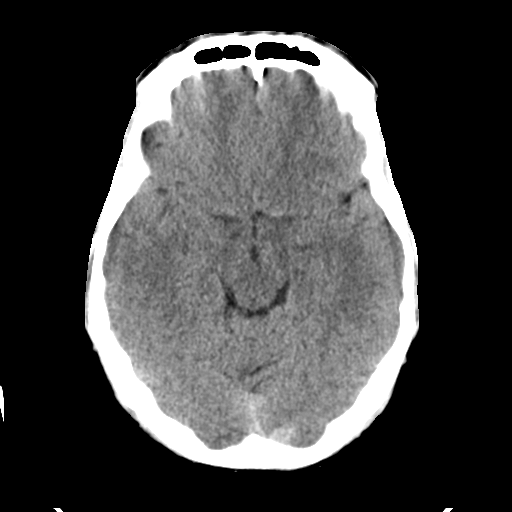
[im 14/34  brain]
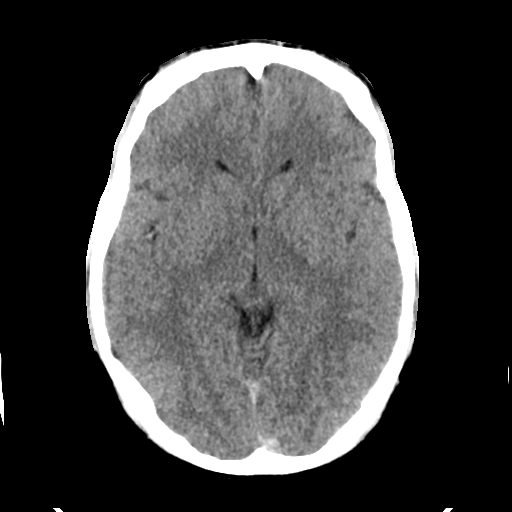
[im 16/34  brain]
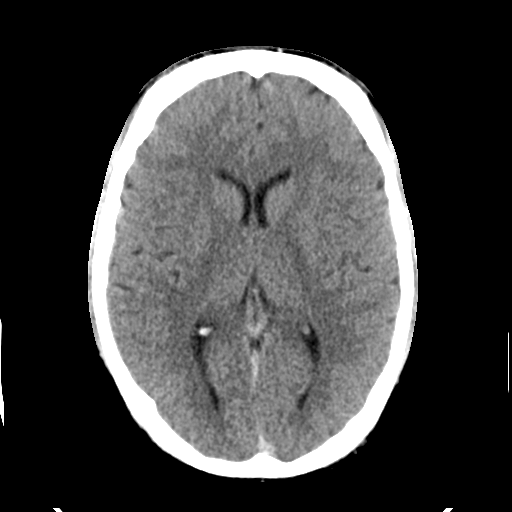
[im 18/34  brain]
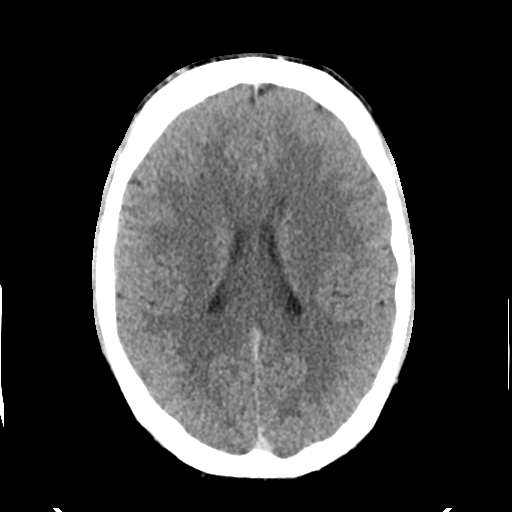
[im 18/34  bone]
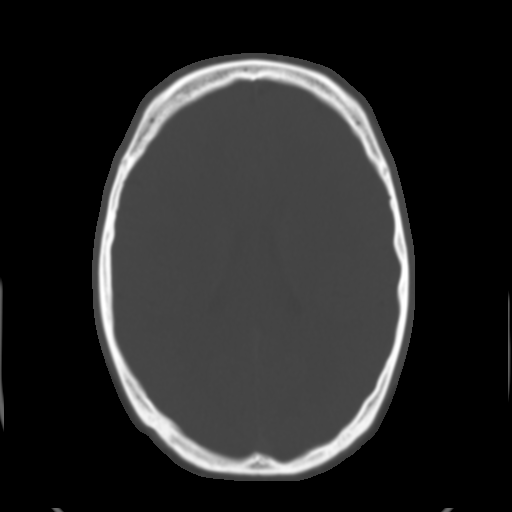
[im 20/34  brain]
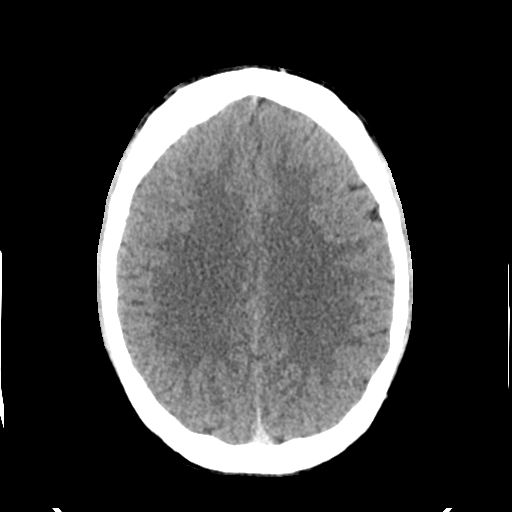
[im 22/34  brain]
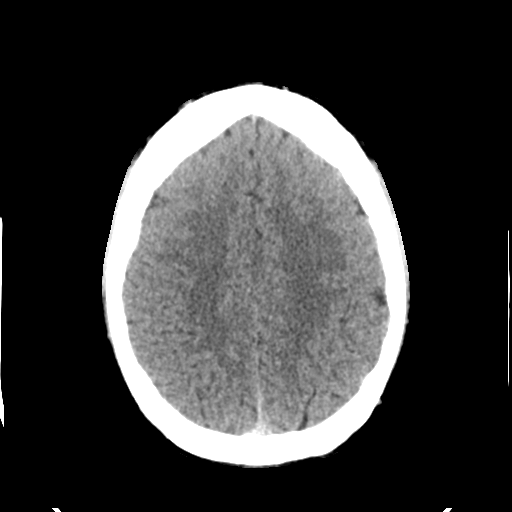
[im 24/34  brain]
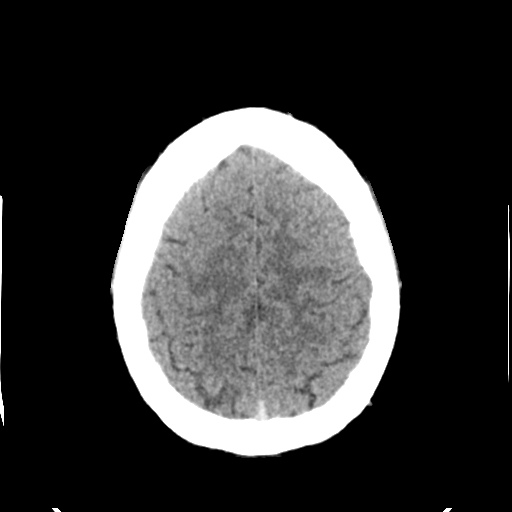
[im 26/34  brain]
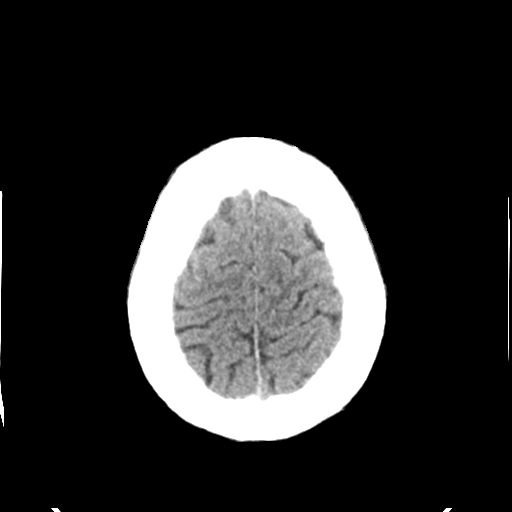
[im 26/34  bone]
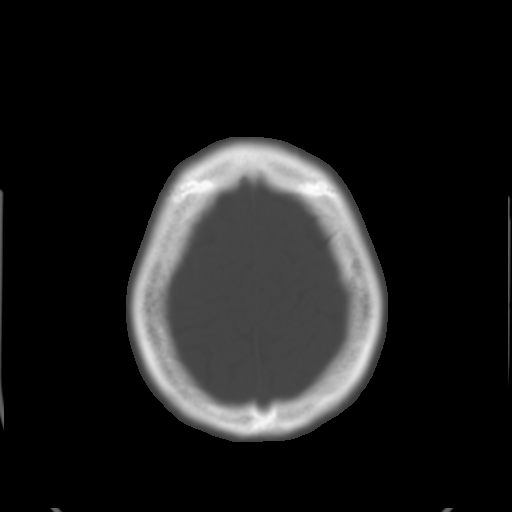
[im 28/34  brain]
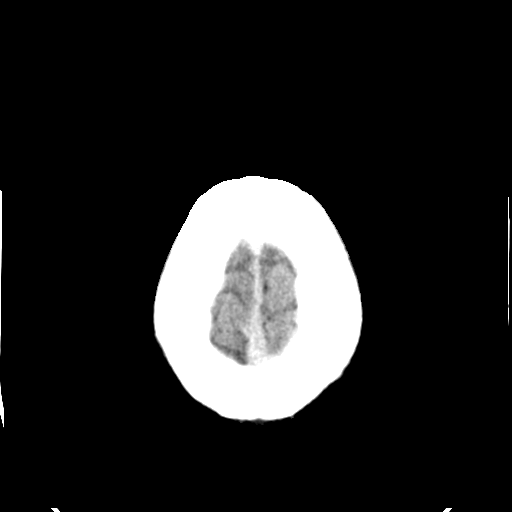
[im 30/34  brain]
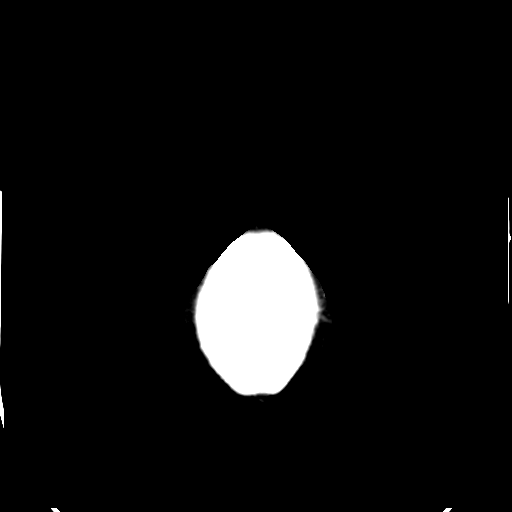
[im 32/34  brain]
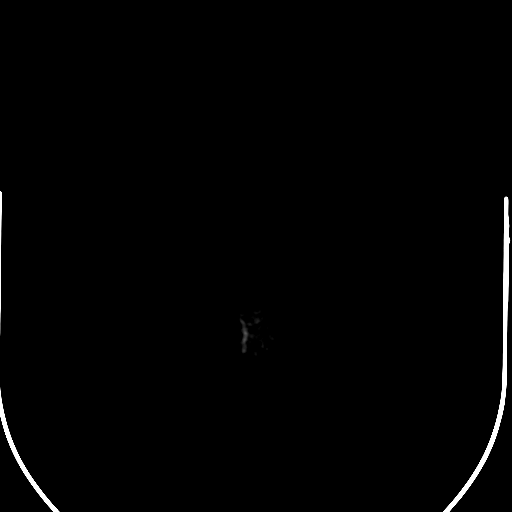

[16 of 30 positions shown; findings below may reference images not displayed]

FINDINGS: Negative for acute intracranial hemorrhage, acute infarction, mass,
mass effect, hydrocephalus or midline shift. Gray-white
differentiation is preserved throughout. No acute soft tissue or
calvarial abnormality. The globes and orbits are symmetric and
unremarkable. Normal aeration of the mastoid air cells and
visualized paranasal sinuses.
IMPRESSION: Negative head CT.

## 2017-06-08 IMAGING — RF DG FLUORO GUIDE LUMBAR PUNCTURE
1 series · 1 of 1 positions shown · non-contrast
Comparison: none

CLINICAL DATA: Onset of neurologic symptoms since receiving the flu
shot in September 2015

[Series 1: cp_standard · 0.18mm/px · 1 of 1 slices shown]
[im 1/1]
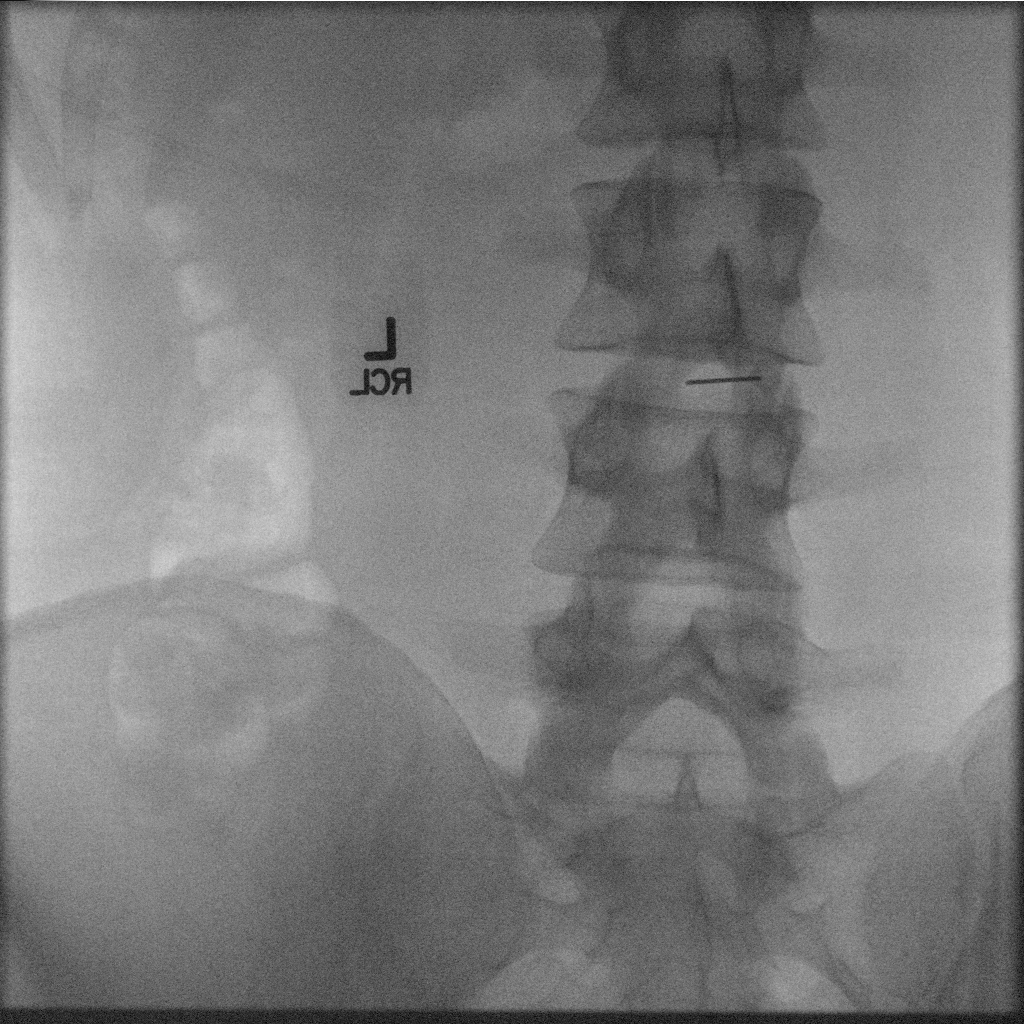

[1 of 1 positions shown; findings below may reference images not displayed]

EXAM:
DIAGNOSTIC LUMBAR PUNCTURE UNDER FLUOROSCOPIC GUIDANCE

FLUOROSCOPY TIME:  Fluoroscopy Time (in minutes and seconds): 0
minutes, 36 seconds

Number of Acquired Images:  1

PROCEDURE:
Informed consent was obtained from the patient prior to the
procedure, including potential complications of headache, allergy,
and pain. With the patient prone, the lower back was prepped with
Betadine. 1% Lidocaine was used for local anesthesia. Lumbar
puncture was performed at the L3 level using a 22 gauge needle with
return of clear . 3 ml of CSF were obtained for laboratory studies.
The patient tolerated the procedure well and there were no apparent
complications.
IMPRESSION: The patient underwent successful lumbar puncture using fluoroscopic
guidance. Spinal fluid was obtained for the laboratory studies
requested by Dr. Perez Garcia. The patient tolerated the procedure well
and was sent to [facility] in good condition.
